# Patient Record
Sex: Female | Born: 1985 | Hispanic: Yes | Marital: Single | Smoking: Never smoker
Health system: Southern US, Community
[De-identification: ages and names within clinical notes are randomized; demographics above are authoritative.]

## PROBLEM LIST (undated history)

## (undated) ENCOUNTER — Inpatient Hospital Stay: Payer: Self-pay

## (undated) HISTORY — PX: CERVICAL BIOPSY: SHX590

---

## 2008-03-22 ENCOUNTER — Emergency Department: Payer: Self-pay | Admitting: Emergency Medicine

## 2008-03-24 ENCOUNTER — Emergency Department: Payer: Self-pay | Admitting: Emergency Medicine

## 2008-03-24 ENCOUNTER — Ambulatory Visit: Payer: Self-pay | Admitting: Emergency Medicine

## 2008-10-20 ENCOUNTER — Ambulatory Visit: Payer: Self-pay | Admitting: Family Medicine

## 2008-11-07 ENCOUNTER — Observation Stay: Payer: Self-pay | Admitting: Unknown Physician Specialty

## 2008-12-25 ENCOUNTER — Encounter: Payer: Self-pay | Admitting: Maternal & Fetal Medicine

## 2015-01-19 DIAGNOSIS — Z79899 Other long term (current) drug therapy: Secondary | ICD-10-CM | POA: Insufficient documentation

## 2015-01-19 DIAGNOSIS — N3090 Cystitis, unspecified without hematuria: Secondary | ICD-10-CM | POA: Insufficient documentation

## 2015-01-19 DIAGNOSIS — G500 Trigeminal neuralgia: Secondary | ICD-10-CM | POA: Insufficient documentation

## 2015-01-19 DIAGNOSIS — Z792 Long term (current) use of antibiotics: Secondary | ICD-10-CM | POA: Insufficient documentation

## 2015-01-19 DIAGNOSIS — Z3202 Encounter for pregnancy test, result negative: Secondary | ICD-10-CM | POA: Insufficient documentation

## 2015-01-20 ENCOUNTER — Encounter: Payer: Self-pay | Admitting: *Deleted

## 2015-01-20 ENCOUNTER — Emergency Department
Admission: EM | Admit: 2015-01-20 | Discharge: 2015-01-20 | Disposition: A | Discharge status: Home / Self Care | Payer: Medicaid Other | Attending: Emergency Medicine | Admitting: Emergency Medicine

## 2015-01-20 ENCOUNTER — Other Ambulatory Visit: Payer: Self-pay

## 2015-01-20 DIAGNOSIS — G500 Trigeminal neuralgia: Secondary | ICD-10-CM

## 2015-01-20 DIAGNOSIS — N3090 Cystitis, unspecified without hematuria: Secondary | ICD-10-CM

## 2015-01-20 LAB — COMPREHENSIVE METABOLIC PANEL
ALT: 12 U/L — AB (ref 14–54)
ANION GAP: 8 (ref 5–15)
AST: 16 U/L (ref 15–41)
Albumin: 4.9 g/dL (ref 3.5–5.0)
Alkaline Phosphatase: 56 U/L (ref 38–126)
BILIRUBIN TOTAL: 0.7 mg/dL (ref 0.3–1.2)
BUN: 10 mg/dL (ref 6–20)
CO2: 27 mmol/L (ref 22–32)
Calcium: 9.6 mg/dL (ref 8.9–10.3)
Chloride: 103 mmol/L (ref 101–111)
Creatinine, Ser: 0.64 mg/dL (ref 0.44–1.00)
GFR calc Af Amer: >60 mL/min (ref >60)
GFR calc non Af Amer: >60 mL/min (ref >60)
Glucose, Bld: 107 mg/dL — ABNORMAL HIGH (ref 65–99)
Potassium: 3.5 mmol/L (ref 3.5–5.1)
Sodium: 138 mmol/L (ref 135–145)
TOTAL PROTEIN: 9.2 g/dL — AB (ref 6.5–8.1)

## 2015-01-20 LAB — CBC WITH DIFFERENTIAL/PLATELET
BASOS ABS: 0.1 10*3/uL (ref 0–0.1)
Basophils Relative: 1 %
Eosinophils Absolute: 0.3 10*3/uL (ref 0–0.7)
Eosinophils Relative: 3 %
HEMATOCRIT: 37.5 % (ref 35.0–47.0)
Hemoglobin: 12.6 g/dL (ref 12.0–16.0)
Lymphocytes Relative: 19 %
Lymphs Abs: 2 10*3/uL (ref 1.0–3.6)
MCH: 28.8 pg (ref 26.0–34.0)
MCHC: 33.6 g/dL (ref 32.0–36.0)
MCV: 85.7 fL (ref 80.0–100.0)
Monocytes Absolute: 0.5 10*3/uL (ref 0.2–0.9)
Monocytes Relative: 4 %
NEUTROS PCT: 73 %
Neutro Abs: 7.5 10*3/uL — ABNORMAL HIGH (ref 1.4–6.5)
Platelets: 237 10*3/uL (ref 150–440)
RBC: 4.37 MIL/uL (ref 3.80–5.20)
RDW: 14.6 % — ABNORMAL HIGH (ref 11.5–14.5)
WBC: 10.2 10*3/uL (ref 3.6–11.0)

## 2015-01-20 LAB — TROPONIN I: Troponin I: <0.03 ng/mL (ref <0.031)

## 2015-01-20 LAB — LIPASE, BLOOD: Lipase: 37 U/L (ref 22–51)

## 2015-01-20 LAB — PREGNANCY, URINE: Preg Test, Ur: NEGATIVE

## 2015-01-20 MED ORDER — CARBAMAZEPINE 100 MG PO CHEW: 100.0000 mg | CHEWABLE_TABLET | Freq: Two times a day (BID) | ORAL | Status: DC

## 2015-01-20 MED ORDER — CEPHALEXIN 500 MG PO CAPS: 500.0000 mg | ORAL_CAPSULE | Freq: Three times a day (TID) | ORAL | Status: DC

## 2015-01-20 NOTE — ED Provider Notes (Addendum)
[facility] Emergency Department Provider Note  ____________________________________________  Time seen: 2:10 AM  I have reviewed the triage vital signs and the nursing notes.   HISTORY  Chief Complaint Abdominal Cramping  Seen with Spanish interpreter  HPI Stacy Blankenship is a 29 y.o. female who complains of gradual onset gradually worsening generalized cramping abdominal pain starting at 10:30 PM tonight and lasting for about 2 hours. He is currently resolved. She has no nausea vomiting or diarrhea. She does report having dysuria for the past 2-3 days. No fever or chills, and normal oral intake at this time.  She also reports dizziness and intermittent left facial pain that is sharp and stabbing and lasting about one hour at a time. A in the left forehead and left maxillary area. She's never had anything like that before and denies any trauma or weakness or vision changes. With the pain she also experiences lacrimation of the left eye. No vision changes or headaches other than that.  Last menstrual period was May 30 which was normal timing for her no vaginal bleeding or discharge.   History reviewed. No pertinent past medical history.  There are no active problems to display for this patient.   Past Surgical History  Procedure Laterality Date  . Cervical biopsy      Current Outpatient Rx  Name  Route  Sig  Dispense  Refill  . carbamazepine (TEGRETOL) 100 MG chewable tablet   Oral   Chew 1 tablet (100 mg total) by mouth 2 (two) times daily.   14 tablet   0   . cephALEXin (KEFLEX) 500 MG capsule   Oral   Take 1 capsule (500 mg total) by mouth 3 (three) times daily.   21 capsule   0     Allergies Review of patient's allergies indicates no known allergies.  History reviewed. No pertinent family history.  Social History History  Substance Use Topics  . Smoking status: Never Smoker   . Smokeless tobacco: Never Used  . Alcohol Use:  No    Review of Systems  Constitutional: No fever or chills. No weight changes Eyes:No blurry vision or double vision.  ENT: No sore throat. Cardiovascular: No chest pain. Respiratory: No dyspnea or cough. Gastrointestinal: Generalized abdominal pain without vomiting or diarrhea.  No BRBPR or melena. Genitourinary: Negative for dysuria, urinary retention, bloody urine, or difficulty urinating. Musculoskeletal: Negative for back pain. No joint swelling or pain. Skin: Negative for rash. Neurological: Left facial pain as above. Psychiatric:No anxiety or depression.   Endocrine:No hot/cold intolerance, changes in energy, or sleep difficulty.  10-point ROS otherwise negative.  ____________________________________________   PHYSICAL EXAM:  VITAL SIGNS: ED Triage Vitals  Enc Vitals Group     BP 01/20/15 0040 100/66 mmHg     Pulse Rate 01/20/15 0040 76     Resp 01/20/15 0040 99     Temp 01/20/15 0040 98.3 F (36.8 C)     Temp Source 01/20/15 0040 Oral     SpO2 01/20/15 0040 100 %     Weight 01/20/15 0040 130 lb (58.968 kg)     Height 01/20/15 0040  (1.6 m)     Head Cir --      Peak Flow --      Pain Score --      Pain Loc --      Pain Edu? --      Excl. in GC? --      Constitutional: Alert and oriented.  Well appearing and in no distress. Eyes: No scleral icterus. No conjunctival pallor. PERRL. EOMI ENT   Head: Normocephalic and atraumatic.   Nose: No congestion/rhinnorhea. No septal hematoma   Mouth/Throat: MMM, no pharyngeal erythema. No peritonsillar mass. No uvula shift.   Neck: No stridor. No SubQ emphysema. No meningismus. Hematological/Lymphatic/Immunilogical: No cervical lymphadenopathy. Cardiovascular: RRR. Normal and symmetric distal pulses are present in all extremities. No murmurs, rubs, or gallops. Respiratory: Normal respiratory effort without tachypnea nor retractions. Breath sounds are clear and equal bilaterally. No  wheezes/rales/rhonchi. Gastrointestinal: Soft and nontender. No distention. There is no CVA tenderness.  No rebound, rigidity, or guarding. Genitourinary: deferred Musculoskeletal: Nontender with normal range of motion in all extremities. No joint effusions.  No lower extremity tenderness.  No edema. Neurologic:   Normal speech and language.  CN 2-10 normal. Motor grossly intact. No pronator drift.  Normal gait. No gross focal neurologic deficits are appreciated.  Skin:  Skin is warm, dry and intact. No rash noted.  No petechiae, purpura, or bullae. Psychiatric: Mood and affect are normal. Speech and behavior are normal. Patient exhibits appropriate insight and judgment.  ____________________________________________    LABS (pertinent positives/negatives) (all labs ordered are listed, but only abnormal results are displayed) Labs Reviewed  CBC WITH DIFFERENTIAL/PLATELET - Abnormal; Notable for the following:    RDW 14.6 (*)    Neutro Abs 7.5 (*)    All other components within normal limits  COMPREHENSIVE METABOLIC PANEL - Abnormal; Notable for the following:    Glucose, Bld 107 (*)    Total Protein 9.2 (*)    ALT 12 (*)    All other components within normal limits  PREGNANCY, URINE  LIPASE, BLOOD  TROPONIN I  URINALYSIS COMPLETEWITH MICROSCOPIC ([facility] ONLY)   ____________________________________________   EKG  Interpreted by me Normal sinus rhythm rate of 69, leftward axis, normal intervals, normal QRS, ST segments and T waves.  ____________________________________________    RADIOLOGY    ____________________________________________   PROCEDURES  ____________________________________________   INITIAL IMPRESSION / ASSESSMENT AND PLAN / ED COURSE  Pertinent labs & imaging results that were available during my care of the patient were reviewed by me and considered in my medical decision making (see chart for details).  Presentation consistent with urinary  tract infection. There is no evidence of pyelonephritis. Additionally the patient has symptoms strongly consistent with trigeminal neuralgia. I'll put her on a trial of carbamazepine and [facility] her symptoms. Well appearing no acute distress symptoms improving spontaneously.  ____________________________________________   FINAL CLINICAL IMPRESSION(S) / ED DIAGNOSES  Final diagnoses:  Cystitis  Trigeminal neuralgia of left side of face      Sharman CheekPhillip Darris Staiger, MD 01/20/15 0235  Sharman CheekPhillip Kielan Dreisbach, MD 01/20/15 [phone]

## 2015-01-20 NOTE — ED Notes (Signed)
Pt states began to experience all over body cramps including abd at 2230 yesterday and they lasted approx 1.5 hours. Pt states while having abd cramping she felt intermittently dizzy, pt denies fever, chills, n/v/d. Pt also complains of intermittent for one week left sided upper facial pain that is pulsing in nature from lateral left eye to left cheek. Pt with redness noted to left inner sclera. Cms intact in all extremities, smile symmetrical.

## 2015-01-20 NOTE — Discharge Instructions (Signed)
Neuralgia del trigmino (Trigeminal Neuralgia) La neuralgia del trigmino es un trastorno nervioso que causa ataques sbitos de intenso dolor facial. La causa es un trastorno en el nervio trigmino, uno de los nervios principales de la cara. Es ms frecuente en las mujeres y en los Stacy Blankenship, aunque tambin puede ocurrir en pacientes ms jvenes. Los ataques pueden durar desde algunos segundos hasta varios minutos, y pueden producirse una o dos veces por ao hasta varias veces por da. La neuralgia del trigmino puede ser un trastorno muy angustiante y discapacitante. Podr ser Stacy Huxleynecesaria una ciruga en los casos muy graves, si el tratamiento mdico no proporciona alivio. INSTRUCCIONES PARA EL CUIDADO DOMICILIARIO  Si su mdico le prescribe medicamentos para prevenir los ataques, tmelos como se le indica.  Para prevenir los ataques:  Mastique con el lado de la boca que no est afectado.  Evite tocarse el rostro.  Evite las rfagas de aire fro o caliente.  Los hombres pueden dejarse crecer la barba para Civil Service fast streamerevitar afeitarse. SOLICITE ATENCIN MDICA DE INMEDIATO SI:  El dolor es insoportable y los medicamentos no lo Egyptayudan.  Le aparecen nuevos e inexplicables sntomas (problemas).  Tiene algn problema que pueda relacionarse con los medicamentos que est tomando. Document Released: 05/14/2005 Document Revised: 10/27/2011 [facility] Patient Information 2015 [place], [place]. This information is not intended to replace advice given to you by your health care provider. Make sure you discuss any questions you have with your health care provider.  Infeccin urinaria  (Urinary Tract Infection)  La infeccin urinaria puede ocurrir en Corporate treasurercualquier lugar del tracto urinario. El tracto urinario es un sistema de drenaje del cuerpo por el que se eliminan los desechos y el exceso de [place]. El tracto urinario est formado por dos riones, dos urteres, la vejiga y Engineer, miningla uretra. Los riones son rganos que tienen  forma de frijol. Cada rin tiene aproximadamente el tamao del puo. Estn situados debajo de las Quioguecostillas, uno a cada lado de la columna vertebral CAUSAS  La causa de la infeccin son los microbios, que son organismos microscpicos, que incluyen hongos, virus, y bacterias. Estos organismos son tan pequeos que slo pueden verse a travs del microscopio. Las bacterias son los microorganismos que ms comnmente causan infecciones urinarias.  SNTOMAS  Los sntomas pueden variar segn la edad y el sexo del paciente y por la ubicacin de la infeccin. Los sntomas en las mujeres jvenes incluyen la necesidad frecuente e intensa de orinar y una sensacin dolorosa de ardor en la vejiga o en la uretra durante la miccin. Las mujeres y los hombres mayores podrn sentir cansancio, temblores y debilidad y Futures tradersentir dolores musculares y Engineer, miningdolor abdominal. Si tiene Wickliffefiebre, puede significar que la infeccin est en los riones. Otros sntomas son dolor en la espalda o en los lados debajo de las [place] Hillcostillas, nuseas y vmitos.  DIAGNSTICO  Para diagnosticar una infeccin urinaria, el mdico le preguntar acerca de sus sntomas. Genuine Partsambin le solicitar una Frederickmuestra de Comorosorina. La muestra de orina se analiza para Engineer, manufacturingdetectar bacterias y glbulos blancos de Risk managerla sangre. Los glbulos blancos se forman en el organismo para ayudar a Artistcombatir las infecciones.  TRATAMIENTO  Por lo general, las infecciones urinarias pueden tratarse con medicamentos. Debido a que la Harley-Davidsonmayora de las infecciones son causadas por bacterias, por lo general pueden tratarse con antibiticos. La eleccin del antibitico y la duracin del tratamiento depender de sus sntomas y el tipo de bacteria causante de la infeccin.  INSTRUCCIONES PARA EL CUIDADO EN EL HOGAR  Si le recetaron antibiticos, tmelos exactamente como su mdico le indique. Termine el medicamento aunque se sienta mejor despus de haber tomado slo algunos.  Beba gran cantidad de lquido para  mantener la orina de tono claro o color amarillo plido.  Evite la cafena, el t y las [address] Estas sustancias irritan la vejiga.  Vaciar la vejiga con frecuencia. Evite retener la orina durante largos perodos.  Vace la vejiga antes y despus de Management consultant.  Despus de mover el intestino, las mujeres deben higienizarse la regin perineal desde adelante hacia atrs. Use slo un papel tissue por vez. SOLICITE ATENCIN MDICA SI:   Siente dolor en la espalda.  Le sube la fiebre.  Los sntomas no mejoran luego de [address]. SOLICITE ATENCIN MDICA DE INMEDIATO SI:   Siente dolor intenso en la espalda o en la zona inferior del abdomen.  Comienza a sentir escalofros.  Tiene nuseas o vmitos.  Tiene una sensacin continua de quemazn o molestias al [employer]. ASEGRESE DE QUE:   Comprende estas instrucciones.  Controlar su enfermedad.  Solicitar ayuda de inmediato si no mejora o empeora. Document Released: 05/14/2005 Document Revised: 04/28/2012 [facility] Patient Information 2015 [place], [place]. This information is not intended to replace advice given to you by your health care provider. Make sure you discuss any questions you have with your health care provider.

## 2015-01-20 NOTE — ED Notes (Signed)
Pt c/o cramping abdominal pain starting at 2230 last night lasting until 0030. Pt in no acute distress at this time. Pt denies n/v/d during this time. Pt states various unrelated pain and cramping in fingers.

## 2015-08-02 LAB — OB RESULTS CONSOLE HIV ANTIBODY (ROUTINE TESTING)
HIV: NONREACTIVE
HIV: NONREACTIVE

## 2015-08-02 LAB — OB RESULTS CONSOLE HEPATITIS B SURFACE ANTIGEN: HEP B S AG: NEGATIVE

## 2015-08-02 LAB — OB RESULTS CONSOLE VARICELLA ZOSTER ANTIBODY, IGG: VARICELLA IGG: IMMUNE

## 2015-08-02 LAB — OB RESULTS CONSOLE RPR: RPR: NONREACTIVE

## 2015-08-02 LAB — OB RESULTS CONSOLE RUBELLA ANTIBODY, IGM: RUBELLA: IMMUNE

## 2015-08-19 NOTE — L&D Delivery Note (Signed)
Delivery Note At 3:51 PM a viable female was delivered via Vaginal, Spontaneous Delivery (Presentation: ;  ).  APGAR: 7, 9; weight  .   Placenta status: , Spontaneous.  Cord: 3 vessels with the following complications: .  Cord pH: not done. Delayed cord clamping  done.   Anesthesia: None  Episiotomy:  None  Lacerations:  First degree vaginal Suture Repair: 2.0 vicryl Est. Blood Loss (mL):  450cc 0.2 mg Methergine given  Mom to postpartum.  Baby to Couplet care / Skin to Skin.  [name] 02/15/2016, 4:15 PM

## 2015-09-12 ENCOUNTER — Other Ambulatory Visit: Payer: Self-pay | Admitting: Primary Care

## 2015-09-12 DIAGNOSIS — Z3492 Encounter for supervision of normal pregnancy, unspecified, second trimester: Secondary | ICD-10-CM

## 2015-09-18 ENCOUNTER — Ambulatory Visit
Admission: RE | Admit: 2015-09-18 | Discharge: 2015-09-18 | Disposition: A | Discharge status: Home / Self Care | Payer: Self-pay | Source: Ambulatory Visit | Attending: Primary Care | Admitting: Primary Care

## 2015-09-18 ENCOUNTER — Ambulatory Visit: Payer: Medicaid Other

## 2015-09-18 DIAGNOSIS — Z36 Encounter for antenatal screening of mother: Secondary | ICD-10-CM | POA: Insufficient documentation

## 2015-09-18 DIAGNOSIS — Z3A18 18 weeks gestation of pregnancy: Secondary | ICD-10-CM | POA: Insufficient documentation

## 2015-09-18 DIAGNOSIS — Z3492 Encounter for supervision of normal pregnancy, unspecified, second trimester: Secondary | ICD-10-CM

## 2015-09-25 ENCOUNTER — Encounter: Payer: Self-pay | Admitting: *Deleted

## 2015-09-25 ENCOUNTER — Inpatient Hospital Stay
Admission: EM | Admit: 2015-09-25 | Discharge: 2015-09-25 | Disposition: A | Discharge status: Home / Self Care | Payer: Self-pay | Attending: Obstetrics and Gynecology | Admitting: Obstetrics and Gynecology

## 2015-09-25 DIAGNOSIS — O2342 Unspecified infection of urinary tract in pregnancy, second trimester: Secondary | ICD-10-CM | POA: Insufficient documentation

## 2015-09-25 DIAGNOSIS — O26892 Other specified pregnancy related conditions, second trimester: Secondary | ICD-10-CM | POA: Insufficient documentation

## 2015-09-25 DIAGNOSIS — R300 Dysuria: Secondary | ICD-10-CM | POA: Diagnosis present

## 2015-09-25 DIAGNOSIS — R1031 Right lower quadrant pain: Secondary | ICD-10-CM | POA: Insufficient documentation

## 2015-09-25 DIAGNOSIS — Z3A20 20 weeks gestation of pregnancy: Secondary | ICD-10-CM | POA: Insufficient documentation

## 2015-09-25 LAB — URINALYSIS COMPLETE WITH MICROSCOPIC (ARMC ONLY)
Bilirubin Urine: NEGATIVE
Glucose, UA: NEGATIVE mg/dL
Hgb urine dipstick: NEGATIVE
Ketones, ur: NEGATIVE mg/dL
Nitrite: POSITIVE — AB
Protein, ur: NEGATIVE mg/dL
Specific Gravity, Urine: 1.012 (ref 1.005–1.030)
pH: 6 (ref 5.0–8.0)

## 2015-09-25 LAB — URINE DRUG SCREEN, QUALITATIVE (ARMC ONLY)
AMPHETAMINES, UR SCREEN: NOT DETECTED
BARBITURATES, UR SCREEN: NOT DETECTED
Benzodiazepine, Ur Scrn: NOT DETECTED
Cannabinoid 50 Ng, Ur ~~LOC~~: NOT DETECTED
Cocaine Metabolite,Ur ~~LOC~~: NOT DETECTED
MDMA (Ecstasy)Ur Screen: NOT DETECTED
Methadone Scn, Ur: NOT DETECTED
Opiate, Ur Screen: NOT DETECTED
Phencyclidine (PCP) Ur S: NOT DETECTED
TRICYCLIC, UR SCREEN: NOT DETECTED

## 2015-09-25 NOTE — OB Triage Note (Signed)
[id number] spanish speaking pt at 20w gestation presents with c/o right sided abdominal pain. No LOF, bleeding. +FM.

## 2015-09-25 NOTE — H&P (Signed)
ANTEPARTUM ADMISSION HISTORY AND PHYSICAL NOTE   History of Present Illness: Stacy Blankenship is a 30 y.o. [phone] at [redacted]w[redacted]d admitted for RLQ pain and cannot sleep x 3 nights.  Patient reports the fetal movement as active. Patient reports uterine contraction  activity as none. Patient reports  vaginal bleeding as none. Patient describes fluid per vagina as None. Fetal presentation is unsure.  Patient Active Problem List   Diagnosis Date Noted  . Dysuria 09/25/2015    History reviewed. No pertinent past medical history.  Past Surgical History  Procedure Laterality Date  . Cervical biopsy      OB History  Gravida Para Term Preterm AB SAB TAB Ectopic Multiple Living  # Outcome Date GA Lbr Len/2nd Weight Sex Delivery Anes PTL Lv  6 Current           5 Term 03/24/11    M Vag-Spont  N Y  4 SAB           3 SAB           2 SAB           1 SAB               Social History   Social History  . Marital Status: Single    Spouse Name: N/A  . Number of Children: N/A  . Years of Education: N/A   Social History Main Topics  . Smoking status: Never Smoker   . Smokeless tobacco: Never Used  . Alcohol Use: No  . Drug Use: No  . Sexual Activity: No   Other Topics Concern  . None   Social History Narrative  Pt has one 28 yr old and lives with her sister. Has 4 brothers in [place]. Parents died: Mom died at age 44 of DM and MI. Dad died at 49 of DM and MI.   History reviewed. No pertinent family history.  No Known Allergies  MEDS: PNV's  Review of Systems - Negative except RLQ pain, cannot sleep x 3 days, dysuria History obtained from the patient General ROS: positive for  - chills, insomnia, aching all over  Vitals:  BP 102/55 mmHg  Pulse 67  Temp(Src) 98.5 F (36.9 C) (Oral)  Resp 20  LMP 01/15/2015 (Exact Date) Physical Examination: GENERAL: Well-developed, well-nourished female in no acute distress.  LUNGS: Clear to auscultation bilaterally.  No W/R/R. HEART: Regular rate and rhythm.S1S2, RRR, No M/R/G.  ABDOMEN: Soft, nontender, nondistended. No organomegaly. Gravid at 20 weeks EXTREMITIES: No cyanosis, clubbing, or edema, 2+ distal pulses with DTRs Rt: 1+/0 Lt: 1+/0 CERVIX: Not evaluated and fetal presentation is unknown  Membranes:intact Fetal Monitoring: + FHT's (unable to monitor due to age of fetus) Tocometer: Flat  Labs:  Results for orders placed or performed during the hospital encounter of 09/25/15 (from the past 24 hour(s))  Urinalysis complete, with microscopic ([facility] only)   Collection Time: 09/25/15  3:21 AM  Result Value Ref Range   Color, Urine YELLOW (A) YELLOW   APPearance CLOUDY (A) CLEAR   Glucose, UA NEGATIVE NEGATIVE mg/dL   Bilirubin Urine NEGATIVE NEGATIVE   Ketones, ur NEGATIVE NEGATIVE mg/dL   Specific Gravity, Urine 1.012 1.005 - 1.030   Hgb urine dipstick NEGATIVE NEGATIVE   pH 6.0 5.0 - 8.0   Protein, ur NEGATIVE NEGATIVE mg/dL   Nitrite POSITIVE (A) NEGATIVE   Leukocytes, UA 3+ (A) NEGATIVE  RBC / HPF TOO NUMEROUS TO COUNT 0 - 5 RBC/hpf   WBC, UA TOO NUMEROUS TO COUNT 0 - 5 WBC/hpf   Bacteria, UA MANY (A) NONE SEEN   Squamous Epithelial / LPF 6-30 (A) NONE SEEN   WBC Clumps PRESENT    Mucous PRESENT     Imaging Studies: US Ob Comp + 14 Wk  09/18/2015  CLINICAL DATA:  Pregnancy. EXAM: OBSTETRICAL ULTRASOUND >14 WKS FINDINGS: Number of Fetuses: 1 Heart Rate:  145 bpm Movement: Present Presentation: Cephalic Previa: None Placental Location: Posterior lateral Amniotic Fluid (Subjective): Normal Amniotic Fluid (Objective): Vertical pocket 4.5cm FETAL BIOMETRY BPD:  4.1cm 18w 4d HC:    15.3cm  18w   2d AC:   13.1cm  18w   4d FL:   2.8cm  18w   4d Current Mean GA: 18w 4d              [place] EDC: 02/15/2016 Estimated Fetal Weight:  245g    9%ile FETAL ANATOMY Lateral Ventricles: Visualized Thalami/CSP: Visualized Posterior Fossa:  Visualized Nuchal Region: Visualize    NFT= 3.73mm Upper Lip: Visualized  Spine: Visualized 4 Chamber Heart on Left: Visualized LVOT: Visualized RVOT: Visualized Stomach on Left: Visualized 3 Vessel Cord: Visualized Cord Insertion site: Visualized Kidneys: Visualized Bladder: Visualized Extremities: Visualized Technically difficult due to: Fetal position. Maternal Findings: Cervix:  4.5 cm and closed. IMPRESSION: Single viable intrauterine pregnancy at 18 weeks 4 days. Electronically Signed   By: Maisie Fus  Register   On: 09/18/2015 14:57     Assessment and Plan: Patient Active Problem List   Diagnosis Date Noted  . Dysuria 09/25/2015  Pain in Rt lower abd area 2. UTI 3. IUP at 20 weeks with 9% Growth at 18 week scan 4. Possible depression due to limited support and no relatiionship with either Father of baby 5. LGSIL: colp with poss HGSIL but, no report on pathology to indicate type of cells (Pt failed to fu for 4 month pap in Aug/Sept 2016 6. Out of work till improved (note given) 7. Will communicate to [facility] plans for repeat Pap PP and fetal surveillance with [facility] 8. Enc Benadryl for sleep 9. The IPAD Interpreter was used for this consult.  10. Pt encouraged to fu at [facility] for further care.

## 2015-10-18 LAB — OB RESULTS CONSOLE GC/CHLAMYDIA
CHLAMYDIA, DNA PROBE: NEGATIVE
CHLAMYDIA, DNA PROBE: NEGATIVE
GC PROBE AMP, GENITAL: NEGATIVE
Gonorrhea: NEGATIVE

## 2016-01-12 LAB — OB RESULTS CONSOLE GBS: GBS: NEGATIVE

## 2016-02-14 ENCOUNTER — Inpatient Hospital Stay
Admission: EM | Admit: 2016-02-14 | Discharge: 2016-02-16 | DRG: 775 | Disposition: A | Discharge status: Home / Self Care | Payer: Medicaid Other | Attending: Obstetrics and Gynecology | Admitting: Obstetrics and Gynecology

## 2016-02-14 DIAGNOSIS — O365930 Maternal care for other known or suspected poor fetal growth, third trimester, not applicable or unspecified: Secondary | ICD-10-CM | POA: Diagnosis present

## 2016-02-14 DIAGNOSIS — Z3A40 40 weeks gestation of pregnancy: Secondary | ICD-10-CM | POA: Diagnosis not present

## 2016-02-14 DIAGNOSIS — O480 Post-term pregnancy: Secondary | ICD-10-CM | POA: Diagnosis present

## 2016-02-14 DIAGNOSIS — D509 Iron deficiency anemia, unspecified: Secondary | ICD-10-CM | POA: Diagnosis present

## 2016-02-14 DIAGNOSIS — R87613 High grade squamous intraepithelial lesion on cytologic smear of cervix (HGSIL): Secondary | ICD-10-CM | POA: Diagnosis present

## 2016-02-14 DIAGNOSIS — O9902 Anemia complicating childbirth: Secondary | ICD-10-CM | POA: Diagnosis present

## 2016-02-14 DIAGNOSIS — R87612 Low grade squamous intraepithelial lesion on cytologic smear of cervix (LGSIL): Secondary | ICD-10-CM | POA: Diagnosis present

## 2016-02-14 LAB — CBC
HEMATOCRIT: 33.1 % — AB (ref 35.0–47.0)
Hemoglobin: 11 g/dL — ABNORMAL LOW (ref 12.0–16.0)
MCH: 26.8 pg (ref 26.0–34.0)
MCHC: 33.3 g/dL (ref 32.0–36.0)
MCV: 80.3 fL (ref 80.0–100.0)
Platelets: 146 10*3/uL — ABNORMAL LOW (ref 150–440)
RBC: 4.12 MIL/uL (ref 3.80–5.20)
RDW: 16.2 % — AB (ref 11.5–14.5)
WBC: 10.8 10*3/uL (ref 3.6–11.0)

## 2016-02-14 LAB — TYPE AND SCREEN
ABO/RH(D): O POS
ANTIBODY SCREEN: NEGATIVE

## 2016-02-14 MED ORDER — LACTATED RINGERS IV SOLN: 500.0000 mL | INTRAVENOUS | Status: DC | PRN

## 2016-02-14 MED ORDER — ACETAMINOPHEN 325 MG PO TABS: 650.0000 mg | ORAL_TABLET | ORAL | Status: DC | PRN

## 2016-02-14 MED ORDER — ONDANSETRON HCL 4 MG/2ML IJ SOLN: 4.0000 mg | Freq: Four times a day (QID) | INTRAMUSCULAR | Status: DC | PRN

## 2016-02-14 MED ORDER — SOD CITRATE-CITRIC ACID 500-334 MG/5ML PO SOLN: 30.0000 mL | ORAL | Status: DC | PRN

## 2016-02-14 MED ORDER — OXYTOCIN 40 UNITS IN LACTATED RINGERS INFUSION - SIMPLE MED
2.5000 [IU]/h | INTRAVENOUS | Status: DC
  Administered 2016-02-15: 39.96 [IU]/h via INTRAVENOUS
  Administered 2016-02-15: 2.5 [IU]/h via INTRAVENOUS
  Filled 2016-02-14: qty 1000

## 2016-02-14 MED ORDER — MISOPROSTOL 200 MCG PO TABS
ORAL_TABLET | ORAL | Status: AC
  Filled 2016-02-14: qty 4

## 2016-02-14 MED ORDER — LIDOCAINE HCL (PF) 1 % IJ SOLN
30.0000 mL | INTRAMUSCULAR | Status: DC | PRN
  Filled 2016-02-14: qty 30

## 2016-02-14 MED ORDER — OXYTOCIN BOLUS FROM INFUSION: 500.0000 mL | INTRAVENOUS | Status: DC

## 2016-02-14 MED ORDER — AMMONIA AROMATIC IN INHA
RESPIRATORY_TRACT | Status: AC
  Filled 2016-02-14: qty 10

## 2016-02-14 MED ORDER — LACTATED RINGERS IV SOLN
INTRAVENOUS | Status: DC
  Administered 2016-02-14 – 2016-02-15 (×3): via INTRAVENOUS

## 2016-02-14 MED ORDER — DINOPROSTONE 10 MG VA INST
10.0000 mg | VAGINAL_INSERT | Freq: Once | VAGINAL | Status: AC
  Administered 2016-02-14: 10 mg via VAGINAL
  Filled 2016-02-14: qty 1

## 2016-02-14 MED ORDER — BUTORPHANOL TARTRATE 1 MG/ML IJ SOLN
1.0000 mg | INTRAMUSCULAR | Status: DC | PRN
  Administered 2016-02-15: 1 mg via INTRAVENOUS
  Filled 2016-02-14: qty 1

## 2016-02-14 MED ORDER — TERBUTALINE SULFATE 1 MG/ML IJ SOLN: 0.2500 mg | Freq: Once | INTRAMUSCULAR | Status: DC | PRN

## 2016-02-14 NOTE — H&P (Signed)
OB ADMISSION/ HISTORY & PHYSICAL:  Admission Date: 02/14/2016  4:19 PM  Admit Diagnosis: Postdates induction of labor at 40+6 weeks   Stacy Blankenship is a 30 y.o. female [id number] at 7140+6 presenting for induction of labor for postdates.     Prenatal History: [id number]   EDC : 02/08/2016, by Last Menstrual Period 05/03/16 [facility] complicated by: growth restriction in the 9th% at 18 weeks - per records pt. 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 No pertinent past medical history.   Past surgical history:  Past Surgical History  Procedure Laterality Date  . Cervical biopsy      Family History: No family history on file.   Social History:  reports that she has never smoked. She has never used smokeless tobacco. She reports that she does not drink alcohol or use illicit drugs.   Allergies: Review of patient's allergies indicates no known allergies.    Current Medications at time of admission:  Prior to Admission medications   Medication Sig Start Date End Date Taking? Authorizing Provider  ferrous sulfate 325 (65 FE) MG tablet Take 325 mg by mouth daily with breakfast.   Yes Historical Provider, MD  carbamazepine (TEGRETOL) 100 MG chewable tablet Chew 1 tablet (100 mg total) by mouth 2 (two) times daily. 01/20/15 01/20/16  Sharman CheekPhillip Stafford, MD  cephALEXin (KEFLEX) 500 MG capsule Take 1 capsule (500 mg  total) by mouth 3 (three) times daily. Patient not taking: Reported on 02/14/2016 01/20/15   Sharman CheekPhillip Stafford, MD     Review of Systems: Active FM Irregular ctxs No LOF  / SROM  No bloody show    Physical Exam:  VS: Blood pressure 120/72, pulse 81, temperature 98.5 F (36.9 C), temperature source Oral, resp. rate 16, height 5' (1.524 m), weight 70.761 kg (156 lb), last menstrual period 05/04/2015.  General: alert and oriented, appears anxious Heart: RRR Lungs: Clear lung fields Abdomen: Gravid, soft and non-tender, non-distended / uterus: gravid, non-tender Extremities: no edema  Genitalia / VE: Dilation: 2 Effacement (%): 40 Station: -3 Exam by:: Delta Air LinesMeredith Sigmon, CNM  FHR: baseline rate 140 bpm / variability minimal-moderate / accelerations + / no decelerations TOCO: irritability   Assessment: 40+[redacted] weeks gestation Induction of labor FHR category 2, with progression to Category 1    Plan:  [facility]   - Cervidil 10mg  vaginally x 1  - Routine Labor and Delivery orders  - Stadol 1mg  IVP every 1hour PRN  - May have epidural upon request 2. GBS Negative  - No prophylaxis indicated 3. Contraception  - unknown  4. Anticipate NSVD  - Proven Pelvis: 8#14 5. Needs repeat pap at 6 week PP visit for LGSIL: colp with poss HGSIL but, no report on pathology to indicate type of cells (Pt failed to fu for 4 month pap in Aug/Sept 2016  Dr. Dalbert GarnetBeasley notified of admission / plan of care  Carlean JewsMeredith Sigmon, CNM

## 2016-02-15 LAB — RPR: RPR Ser Ql: NONREACTIVE

## 2016-02-15 MED ORDER — ACETAMINOPHEN 325 MG PO TABS: 650.0000 mg | ORAL_TABLET | ORAL | Status: DC | PRN

## 2016-02-15 MED ORDER — ONDANSETRON HCL 4 MG PO TABS: 4.0000 mg | ORAL_TABLET | ORAL | Status: DC | PRN

## 2016-02-15 MED ORDER — METHYLERGONOVINE MALEATE 0.2 MG/ML IJ SOLN
INTRAMUSCULAR | Status: AC
  Administered 2016-02-15: 0.2 mg
  Filled 2016-02-15: qty 1

## 2016-02-15 MED ORDER — MAGNESIUM HYDROXIDE 400 MG/5ML PO SUSP: 30.0000 mL | ORAL | Status: DC | PRN

## 2016-02-15 MED ORDER — OXYTOCIN 40 UNITS IN LACTATED RINGERS INFUSION - SIMPLE MED
1.0000 m[IU]/min | INTRAVENOUS | Status: DC
  Administered 2016-02-15: 666 m[IU]/min via INTRAVENOUS
  Administered 2016-02-15: 1 m[IU]/min via INTRAVENOUS

## 2016-02-15 MED ORDER — SENNOSIDES-DOCUSATE SODIUM 8.6-50 MG PO TABS
2.0000 | ORAL_TABLET | ORAL | Status: DC
Start: 2016-02-16 — End: 2016-02-16
  Administered 2016-02-16: 2 via ORAL
  Filled 2016-02-15: qty 2

## 2016-02-15 MED ORDER — DIBUCAINE 1 % RE OINT: 1.0000 "application " | TOPICAL_OINTMENT | RECTAL | Status: DC | PRN

## 2016-02-15 MED ORDER — SODIUM CHLORIDE FLUSH 0.9 % IV SOLN
INTRAVENOUS | Status: AC
  Filled 2016-02-15: qty 10

## 2016-02-15 MED ORDER — WITCH HAZEL-GLYCERIN EX PADS: 1.0000 "application " | MEDICATED_PAD | CUTANEOUS | Status: DC | PRN

## 2016-02-15 MED ORDER — TERBUTALINE SULFATE 1 MG/ML IJ SOLN: 0.2500 mg | Freq: Once | INTRAMUSCULAR | Status: DC | PRN

## 2016-02-15 MED ORDER — SIMETHICONE 80 MG PO CHEW: 80.0000 mg | CHEWABLE_TABLET | ORAL | Status: DC | PRN

## 2016-02-15 MED ORDER — DIPHENHYDRAMINE HCL 25 MG PO CAPS: 25.0000 mg | ORAL_CAPSULE | Freq: Four times a day (QID) | ORAL | Status: DC | PRN

## 2016-02-15 MED ORDER — COCONUT OIL OIL: 1.0000 "application " | TOPICAL_OIL | Status: DC | PRN

## 2016-02-15 MED ORDER — MEASLES, MUMPS & RUBELLA VAC ~~LOC~~ INJ
0.5000 mL | INJECTION | Freq: Once | SUBCUTANEOUS | Status: DC
  Filled 2016-02-15: qty 0.5

## 2016-02-15 MED ORDER — ZOLPIDEM TARTRATE 5 MG PO TABS: 5.0000 mg | ORAL_TABLET | Freq: Every evening | ORAL | Status: DC | PRN

## 2016-02-15 MED ORDER — ONDANSETRON HCL 4 MG/2ML IJ SOLN: 4.0000 mg | INTRAMUSCULAR | Status: DC | PRN

## 2016-02-15 MED ORDER — IBUPROFEN 600 MG PO TABS
600.0000 mg | ORAL_TABLET | Freq: Four times a day (QID) | ORAL | Status: DC
  Administered 2016-02-15 – 2016-02-16 (×4): 600 mg via ORAL
  Filled 2016-02-15 (×4): qty 1

## 2016-02-15 MED ORDER — PRENATAL MULTIVITAMIN CH
1.0000 | ORAL_TABLET | Freq: Every day | ORAL | Status: DC
  Administered 2016-02-16: 1 via ORAL
  Filled 2016-02-15: qty 1

## 2016-02-15 MED ORDER — FERROUS SULFATE 325 (65 FE) MG PO TABS
325.0000 mg | ORAL_TABLET | Freq: Two times a day (BID) | ORAL | Status: DC
  Administered 2016-02-16 (×2): 325 mg via ORAL
  Filled 2016-02-15 (×2): qty 1

## 2016-02-15 MED ORDER — BENZOCAINE-MENTHOL 20-0.5 % EX AERO
1.0000 "application " | INHALATION_SPRAY | CUTANEOUS | Status: DC | PRN
  Administered 2016-02-15: 1 via TOPICAL
  Filled 2016-02-15: qty 56

## 2016-02-15 NOTE — Progress Notes (Signed)
Patient ID: Humberto SealsMayra Castillo Cordova, female   DOB: 02/19/1986, 30 y.o.   MRN: [id number] Induction day 2 . 40+6 weeks . 9% EFW  Cervidil last . Pitocin at 5 mu/min .  O: VSS efm : 140 + accels , reasurring  CTX q 3 min  AROM - copious clear fluid . Cx 1-2 cm / 50% and -2 vtx . A: reassuring EFM  P: cont Pitocin .

## 2016-02-15 NOTE — Progress Notes (Signed)
Interpreter here to discuss pain, MD notification of SVE, plan for SVE after 1400, epidural discussed, offered, declined by pt.

## 2016-02-15 NOTE — Progress Notes (Signed)
Patient ID: Stacy Blankenship, female   DOB: 02/08/1986, 30 y.o.   MRN: [id number] cx check  4 cm / 90 /0  IUPC placed  A:  Reassuring fetal monitoring P: cont monitoring

## 2016-02-16 LAB — CBC
HCT: 29.3 % — ABNORMAL LOW (ref 35.0–47.0)
Hemoglobin: 9.5 g/dL — ABNORMAL LOW (ref 12.0–16.0)
MCH: 26.2 pg (ref 26.0–34.0)
MCHC: 32.4 g/dL (ref 32.0–36.0)
MCV: 81 fL (ref 80.0–100.0)
Platelets: 134 10*3/uL — ABNORMAL LOW (ref 150–440)
RBC: 3.62 MIL/uL — ABNORMAL LOW (ref 3.80–5.20)
RDW: 16.5 % — AB (ref 11.5–14.5)
WBC: 14 10*3/uL — ABNORMAL HIGH (ref 3.6–11.0)

## 2016-02-16 MED ORDER — IBUPROFEN 600 MG PO TABS: 600.0000 mg | ORAL_TABLET | Freq: Four times a day (QID) | ORAL | Status: AC

## 2016-02-16 NOTE — Progress Notes (Signed)
Discharge instructions provided.  Pt verbalizes understanding of all instructions and follow-up care via interpreter.  Prescription given. Pt discharged to home with infant at 1800 on 02/16/16 via wheelchair by RN. Reynold BowenSusan Paisley [place]ave, RN 02/16/2016 6:38 PM

## 2016-02-16 NOTE — Progress Notes (Signed)
Prenatal records indicate that pt received TDaP vaccine on 12/06/15. Reynold BowenSusan Paisley Kaulin Chaves, RN 02/16/2016 1:21 PM

## 2016-02-16 NOTE — Discharge Summary (Signed)
Obstetric Discharge Summary Reason for Admission: induction of labor 40+6 and  Possible IUGR 9% on u/s  Prenatal Procedures: none Intrapartum Procedures: spontaneous vaginal delivery Postpartum Procedures: none Complications-Operative and Postpartum:first degree laceration   HEMOGLOBIN  Date Value Ref Range Status  02/16/2016 9.5* 12.0 - 16.0 g/dL Final   HCT  Date Value Ref Range Status  02/16/2016 29.3* 35.0 - 47.0 % Final    Physical Exam:  General: alert and cooperative Lochia: appropriate Uterine Fundus: firm Incision: n/a DVT Evaluation: No evidence of DVT seen on physical exam.  Discharge Diagnoses: Term Pregnancy-delivered, fetal weight  8lbs /9oz.  Discharge Information: Date: 02/16/2016 Activity: pelvic rest Diet: routine Medications: Ibuprofen Condition: stable Instructions: refer to practice specific booklet Discharge to: home Follow-up Information    Follow up with [facility] In 6 weeks.   Specialty:  General Practice   Why:  postpartum care   Contact information:   [address] [place] [address] [phone]       Newborn Data: Live born female  Birth Weight: 8 lb 8.5 oz (3870 g) APGAR: 7, 9  Home with mother.  SCHERMERHORN,THOMAS 02/16/2016, 12:33 PM

## 2016-02-16 NOTE — Discharge Instructions (Signed)
Call your doctor for increased pain or vaginal bleeding, temperature above 100.4, depression, or concerns.  No strenuous activity or heavy lifting for 6 weeks.  No intercourse, tampons, douching, or enemas for 6 weeks.  No tub baths-showers only.  No driving for 2 weeks or while taking pain medications.  Continue prenatal vitamin and iron.  Increase calories and fluids while breastfeeding. °

## 2017-07-30 ENCOUNTER — Emergency Department: Payer: Self-pay

## 2017-07-30 ENCOUNTER — Other Ambulatory Visit: Payer: Self-pay

## 2017-07-30 ENCOUNTER — Emergency Department
Admission: EM | Admit: 2017-07-30 | Discharge: 2017-07-30 | Disposition: A | Discharge status: Home / Self Care | Payer: Self-pay | Attending: Emergency Medicine | Admitting: Emergency Medicine

## 2017-07-30 DIAGNOSIS — R202 Paresthesia of skin: Secondary | ICD-10-CM | POA: Insufficient documentation

## 2017-07-30 DIAGNOSIS — Z791 Long term (current) use of non-steroidal anti-inflammatories (NSAID): Secondary | ICD-10-CM | POA: Insufficient documentation

## 2017-07-30 DIAGNOSIS — Z79899 Other long term (current) drug therapy: Secondary | ICD-10-CM | POA: Insufficient documentation

## 2017-07-30 LAB — BASIC METABOLIC PANEL
Anion gap: 10 (ref 5–15)
BUN: 14 mg/dL (ref 6–20)
CHLORIDE: 102 mmol/L (ref 101–111)
CO2: 24 mmol/L (ref 22–32)
CREATININE: 0.74 mg/dL (ref 0.44–1.00)
Calcium: 9.6 mg/dL (ref 8.9–10.3)
GFR calc Af Amer: >60 mL/min (ref >60)
GFR calc non Af Amer: >60 mL/min (ref >60)
GLUCOSE: 152 mg/dL — AB (ref 65–99)
POTASSIUM: 3.4 mmol/L — AB (ref 3.5–5.1)
SODIUM: 136 mmol/L (ref 135–145)

## 2017-07-30 LAB — TROPONIN I: Troponin I: <0.03 ng/mL (ref <0.03)

## 2017-07-30 LAB — CBC
HEMATOCRIT: 37.6 % (ref 35.0–47.0)
Hemoglobin: 12.5 g/dL (ref 12.0–16.0)
MCH: 26.8 pg (ref 26.0–34.0)
MCHC: 33.2 g/dL (ref 32.0–36.0)
MCV: 80.7 fL (ref 80.0–100.0)
Platelets: 262 10*3/uL (ref 150–440)
RBC: 4.66 MIL/uL (ref 3.80–5.20)
RDW: 17.5 % — AB (ref 11.5–14.5)
WBC: 10.5 10*3/uL (ref 3.6–11.0)

## 2017-07-30 LAB — POCT PREGNANCY, URINE: Preg Test, Ur: NEGATIVE

## 2017-07-30 MED ORDER — HYDROXYZINE HCL 25 MG PO TABS: 25.0000 mg | ORAL_TABLET | Freq: Three times a day (TID) | ORAL | 0 refills | Status: AC | PRN

## 2017-07-30 NOTE — ED Provider Notes (Signed)
[facility] Emergency Department Provider Note  Time seen: 9:43 PM  I have reviewed the triage vital signs and the nursing notes.   HISTORY  Chief Complaint Chest Pain Spanish interpreter present throughout the evaluation.   HPI Stacy Blankenship is a 31 y.o. female with no past medical history who presents to the emergency department with complaints of generalized tingling and lightness.  According to the patient since yesterday she has had a feeling of tingling in lightness in her head and upper extremities bilaterally.  Patient was at work today and was complaining of these feelings so she was sent to the emergency department for evaluation.  Patient denies any nausea, vomiting or diarrhea.  Denies any chest pain or shortness of breath to myself but the triage nurse stated the patient was describing some chest heaviness or tightness.  In review of systems questioning the patient states she was having some abdominal discomfort last night but that has since resolved.  Denies any abdominal pain currently.  Also on review of systems questioning the patient states she has been having intermittent headaches over the past 1 week.  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  History reviewed. No pertinent past medical history.  Patient Active Problem List   Diagnosis Date Noted  . Indication for care in labor and delivery, antepartum 02/14/2016  . Dysuria 09/25/2015    Past Surgical History:  Procedure Laterality Date  . CERVICAL BIOPSY      Prior to Admission medications   Medication Sig Start Date End Date Taking? Authorizing Provider  ferrous sulfate 325 (65 FE) MG tablet Take 325 mg by mouth daily with breakfast.    [provider]  hydrOXYzine (ATARAX/VISTARIL) 25 MG tablet Take 1 tablet (25 mg total) by mouth every 8 (eight) hours as needed for anxiety  (tingling). 07/30/17   Minna AntisPaduchowski, Desjuan Stearns, MD  ibuprofen (ADVIL,MOTRIN) 600 MG tablet Take 1 tablet (600 mg total) by mouth every 6 (six) hours. 02/16/16   Schermerhorn, Ihor Austinhomas J, MD    No Known Allergies  No family history on file.  Social History Social History   Tobacco Use  . Smoking status: Never Smoker  . Smokeless tobacco: Never Used  Substance Use Topics  . Alcohol use: No  . Drug use: No    Review of Systems Constitutional: Negative for fever.  Positive for lightheadedness, and lightness sensation in her arms. Cardiovascular: Negative for chest pain. Respiratory: Negative for shortness of breath. Gastrointestinal: Negative for abdominal pain, vomiting Neurological: Intermittent headache times 1 week.  Denies any weakness All other ROS negative  ____________________________________________   PHYSICAL EXAM:  VITAL SIGNS: ED Triage Vitals  Enc Vitals Group     BP 07/30/17 2033 113/77     Pulse Rate 07/30/17 2033 93     Resp 07/30/17 2033 16     Temp 07/30/17 2033 98.1 F (36.7 C)     Temp Source 07/30/17 2033 Oral     SpO2 07/30/17 2033 100 %     Weight 07/30/17 2012 160 lb (72.6 kg)     Height 07/30/17 2012 5\' 5"  (1.651 m)     Head Circumference --      Peak Flow --      Pain Score 07/30/17 2012 3     Pain Loc --      Pain Edu? --      Excl.  in GC? --    Constitutional: Alert and oriented. Well appearing and in no distress. Eyes: Normal exam ENT   Head: Normocephalic and atraumatic.   Mouth/Throat: Mucous membranes are moist. Cardiovascular: Normal rate, regular rhythm. No murmur Respiratory: Normal respiratory effort without tachypnea nor retractions. Breath sounds are clear  Gastrointestinal: Soft and nontender. No distention. Musculoskeletal: Nontender with normal range of motion in all extremities.  Neurologic:  Normal speech and language. No gross focal neurologic deficits Skin:  Skin is warm, dry and intact.  Psychiatric: Mood and affect  are normal.   ____________________________________________    EKG  EKG reviewed and interpreted by myself shows sinus tachycardia at 103 bpm with a narrow QRS, normal axis, normal intervals, nonspecific ST changes.  ____________________________________________    RADIOLOGY  Chest x-ray negative  ____________________________________________   INITIAL IMPRESSION / ASSESSMENT AND PLAN / ED COURSE  Pertinent labs & imaging results that were available during my care of the patient were reviewed by me and considered in my medical decision making (see chart for details).  Patient presents to the emergency department for lightheadedness and a tingling/lightness sensation in her upper extremities that have been ongoing since yesterday.  Differential would include chemical exposure, paresthesias, anxiety, migraine, left leg abnormality or metabolic abnormality.  Overall the patient appears extremely well on examination with a normal physical exam.  Patient's labs are normal including negative troponin and chest x-ray with a non-concerning EKG.  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  Denies any history of anxiety.  Symptoms she is describing are very consistent with paresthesias.  Will prescribe a short course of hydroxyzine for a trial of symptom relief I discussed taking this medication only if needed.  I also discussed following up with her primary care doctor next week for recheck.  Patient agreeable to this plan of care.  ____________________________________________   FINAL CLINICAL IMPRESSION(S) / ED DIAGNOSES  Paresthesias    Minna AntisPaduchowski, Brittley Regner, MD 07/30/17 2152

## 2017-07-30 NOTE — ED Triage Notes (Addendum)
Pt presents to ED via POV with c/o chest pain described as "tightness" and her "whole body is numb" x2 days; pt also reports 2 weeks of dizziness. Pt reports HA and blurry vision; pt denies trouble swallowing or speaking; no facial droop. Pt reports similar s/x's in the past while at work, but s/x's were attributed to excessive heat in her working conditions.

## 2017-11-13 ENCOUNTER — Other Ambulatory Visit: Payer: Self-pay | Admitting: Neurology

## 2017-11-13 DIAGNOSIS — G939 Disorder of brain, unspecified: Secondary | ICD-10-CM

## 2017-11-16 ENCOUNTER — Ambulatory Visit
Admission: RE | Admit: 2017-11-16 | Discharge: 2017-11-16 | Disposition: A | Discharge status: Home / Self Care | Payer: Self-pay | Source: Ambulatory Visit | Attending: Neurology | Admitting: Neurology

## 2017-11-16 DIAGNOSIS — G939 Disorder of brain, unspecified: Secondary | ICD-10-CM

## 2017-11-16 DIAGNOSIS — Q048 Other specified congenital malformations of brain: Secondary | ICD-10-CM | POA: Insufficient documentation

## 2017-11-16 MED ORDER — GADOBENATE DIMEGLUMINE 529 MG/ML IV SOLN
15.0000 mL | Freq: Once | INTRAVENOUS | Status: AC | PRN
  Administered 2017-11-16: 13 mL via INTRAVENOUS

## 2019-07-31 IMAGING — MR MR HEAD WO/W CM
13 series · 35 of 48 positions shown · IV contrast (multihance)
Comparison: None.

CLINICAL DATA: 32 y/o F; dizziness and pain across the back of the
head with confusion, blurred vision, and weakness in both hands for
4 months.

EXAM:
MRI HEAD WITHOUT AND WITH CONTRAST
TECHNIQUE: Multiplanar, multiecho pulse sequences of the brain and surrounding
structures were obtained without and with intravenous contrast.
CONTRAST:  14mL MULTIHANCE GADOBENATE DIMEGLUMINE 529 MG/ML IV SOLN

[Series 2: T1 · sagittal · 5.0mm · 0.47mm/px · 1 of 21 slices shown (1 of 2)]
[im 1/21]
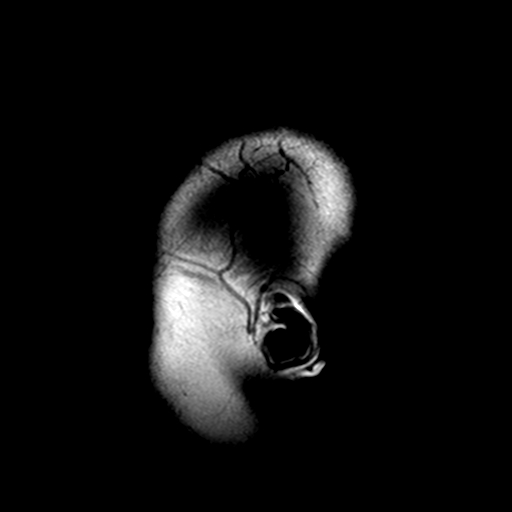

[Series 4: DWI · axial · 3.0mm · 0.94mm/px · z∈[-54,+89]mm · 2 of 50 slices shown (1 of 4)]
[im 1/50]
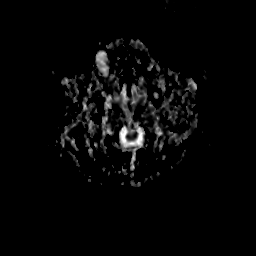
[im 50/50]
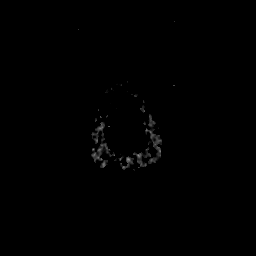

[Series 5: DWI · axial · 3.0mm · 0.94mm/px · z∈[-54,+89]mm · 3 of 50 slices shown (2 of 4)]
[im 1/50]
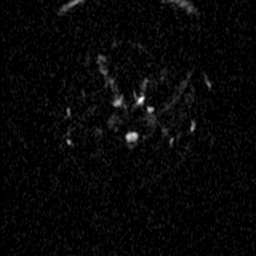
[im 25/50]
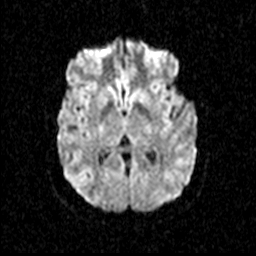
[im 50/50]
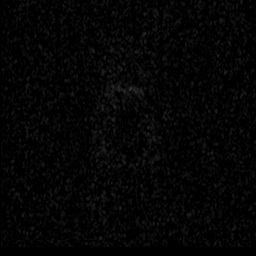

[Series 7: DWI · coronal · 5.0mm · 1.80mm/px · 3 of 39 slices shown (3 of 4)]
[im 1/39]
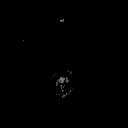
[im 20/39]
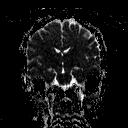
[im 39/39]
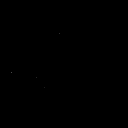

[Series 8: DWI · coronal · 5.0mm · 1.80mm/px · 3 of 38 slices shown (4 of 4)]
[im 1/38]
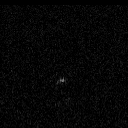
[im 19/38]
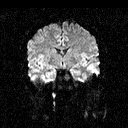
[im 38/38]
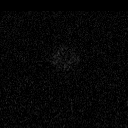

[Series 9: T2 · axial · 5.0mm · 0.45mm/px · z∈[-63,+89]mm · 2 of 23 slices shown (1 of 2)]
[im 1/23]
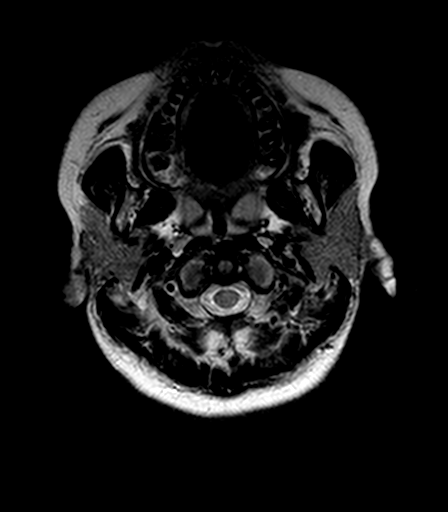
[im 23/23]
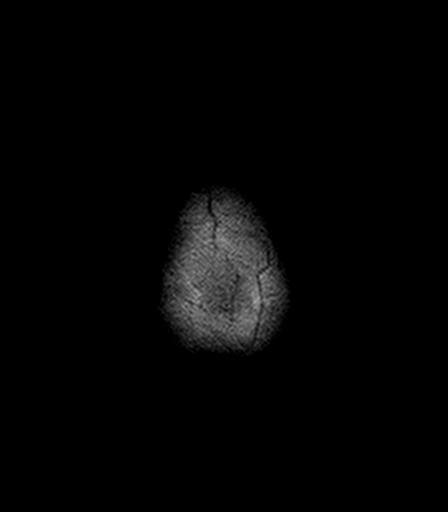

[Series 10: FLAIR · axial · 3.0mm · 0.90mm/px · z∈[-59,+86]mm · 3 of 50 slices shown (1 of 2)]
[im 1/50]
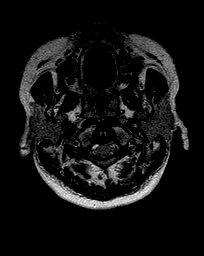
[im 25/50]
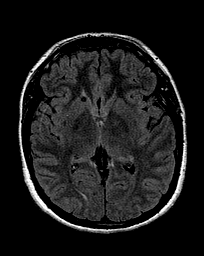
[im 50/50]
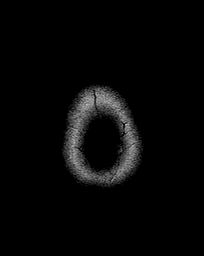

[Series 11: T2 · axial · 5.0mm · 0.45mm/px · z∈[-63,+89]mm · 2 of 23 slices shown (2 of 2)]
[im 1/23]
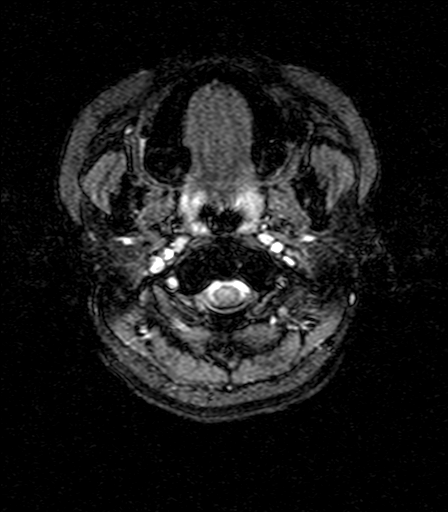
[im 23/23]
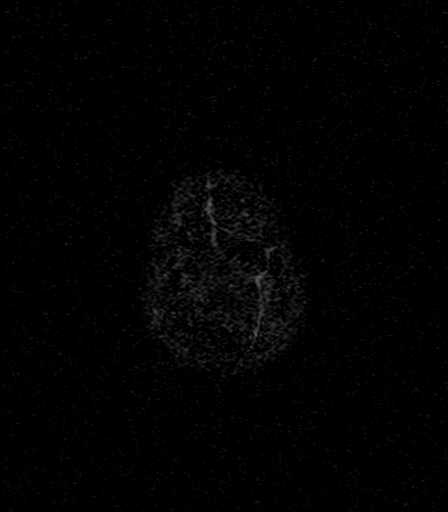

[Series 12: T1 · axial · 1.0mm · 0.50mm/px · z∈[-75,-29]mm · 3 of 176 slices shown (2 of 2)]
[im 1/176]
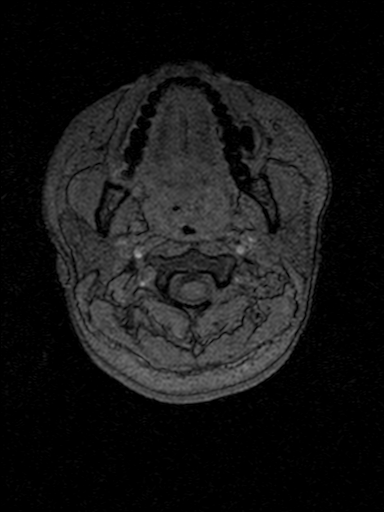
[im 32/176]
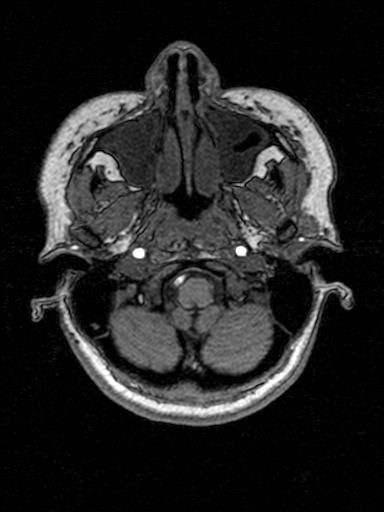
[im 48/176]
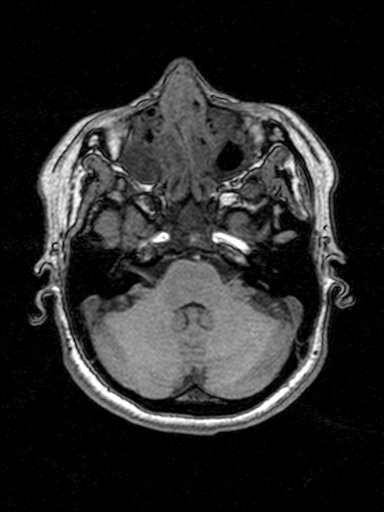

[Series 13: FLAIR · sagittal · 5.0mm · 0.90mm/px · 1 of 21 slices shown (2 of 2)]
[im 1/21]
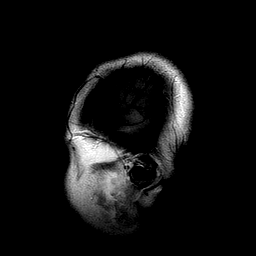

[Series 14: T2 post-contrast · coronal · 5.0mm · 0.45mm/px · 2 of 27 slices shown]
[im 1/27]
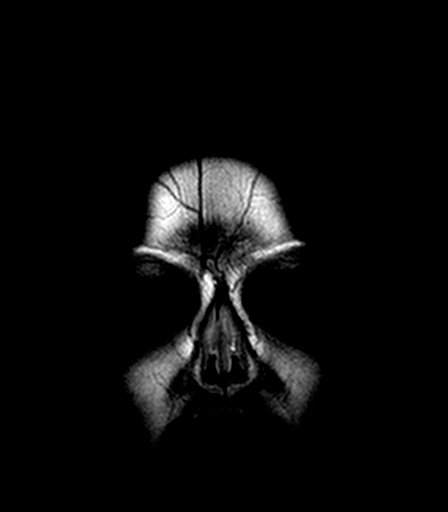
[im 27/27]
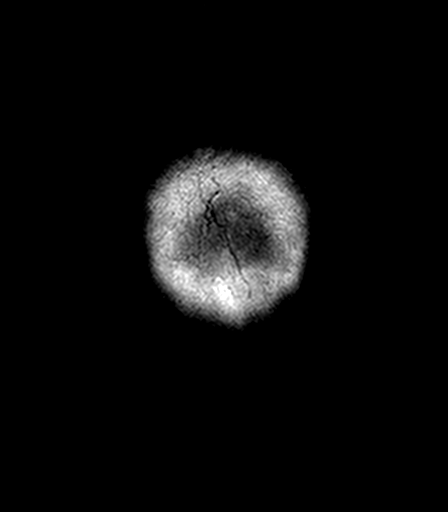

[Series 15: T1 post-contrast · axial · 1.0mm · 0.50mm/px · z∈[-75,+97]mm · 8 of 176 slices shown (1 of 2)]
[im 1/176]
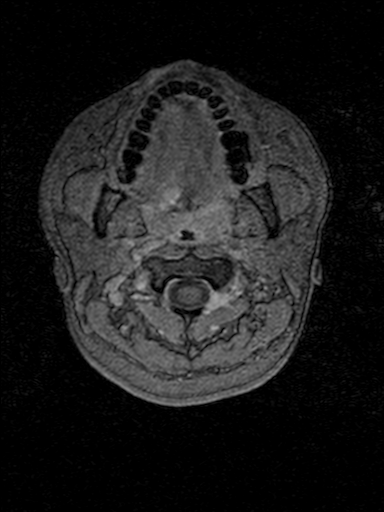
[im 32/176]
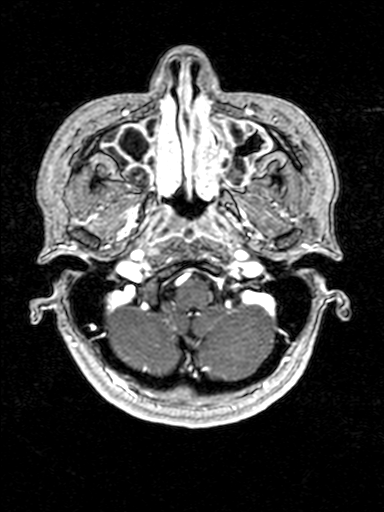
[im 48/176]
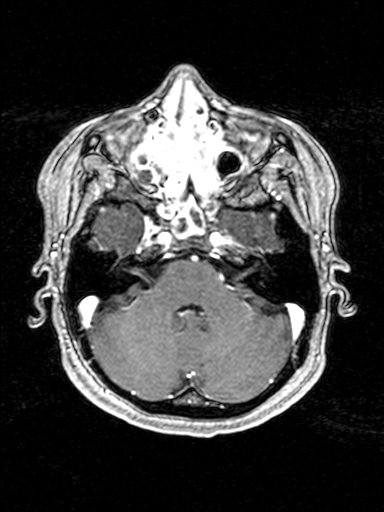
[im 80/176]
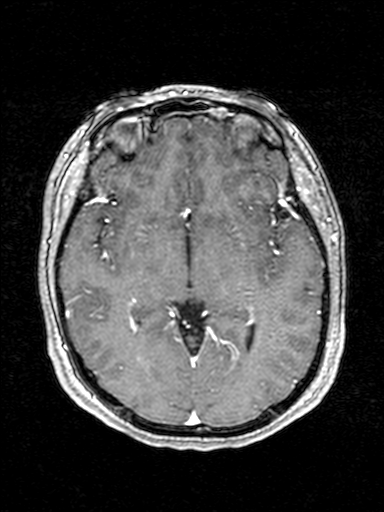
[im 96/176]
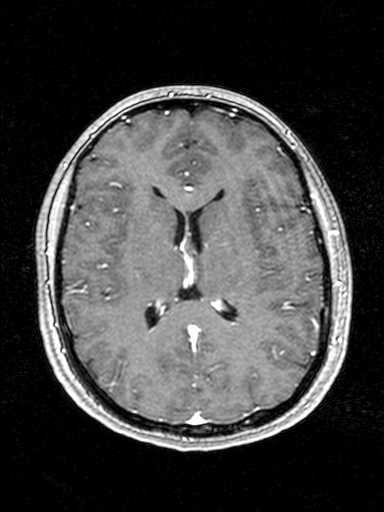
[im 128/176]
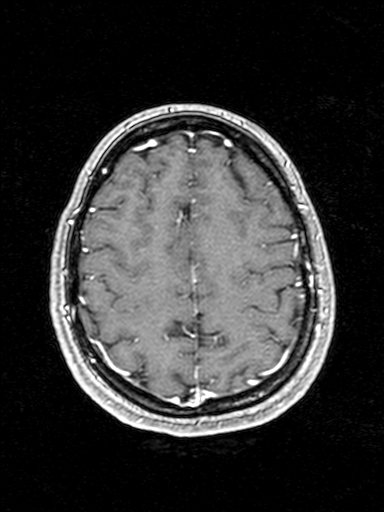
[im 144/176]
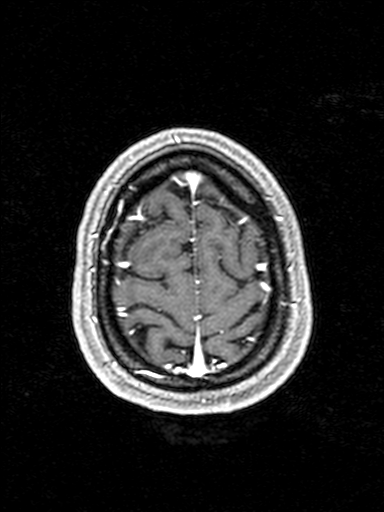
[im 176/176]
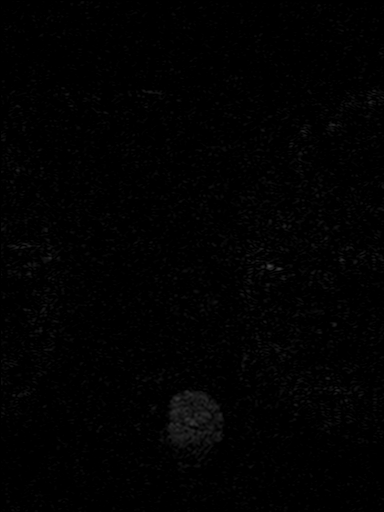

[Series 16: T1 post-contrast · coronal · 5.0mm · 0.45mm/px · 2 of 27 slices shown (2 of 2)]
[im 1/27]
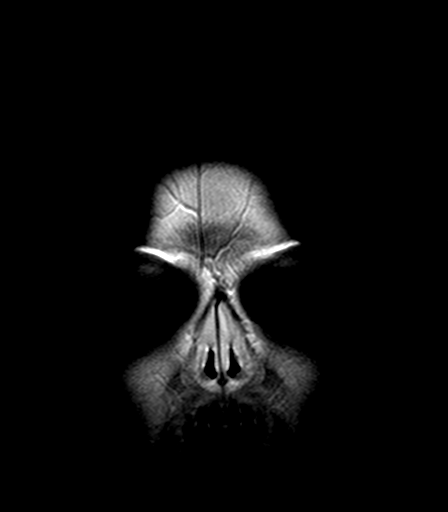
[im 27/27]
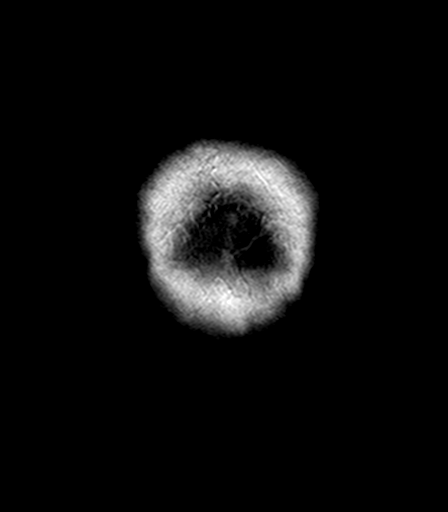

[35 of 48 positions shown; findings below may reference images not displayed]

FINDINGS: Brain: No acute infarction, hemorrhage, hydrocephalus, extra-axial
collection or significant mass effect. 4 mm T2 hypointense, T1
isointense, minimally enhancing focus within the right caudate head
(series 10, image 25) likely representing a small cavernous
malformation. No additional focus of abnormal enhancement in the
brain identified. Single punctate focus of T2 FLAIR hyperintensity
in the left frontal subcortical white matter of unlikely clinical
significance. No white matter lesion is present within corpus
callosum, basal ganglia, or posterior fossa.

Vascular: Normal flow voids.

Skull and upper cervical spine: Normal marrow signal.

Sinuses/Orbits: Extensive paranasal sinus disease with near complete
opacification. No abnormal signal of mastoid air cells. Orbits are
unremarkable.

Other: None.
IMPRESSION: 1. 4 mm cavernous malformation in the right caudate head.
2. Otherwise unremarkable MRI of the brain.

By: Yosvani Dietz M.D.

## 2019-11-11 ENCOUNTER — Ambulatory Visit: Payer: Self-pay | Attending: Internal Medicine

## 2019-11-11 DIAGNOSIS — Z23 Encounter for immunization: Secondary | ICD-10-CM

## 2019-11-11 NOTE — Progress Notes (Signed)
   Covid-19 Vaccination Clinic  Name:  Stacy Blankenship    MRN: [id number] DOB: [birth year]  11/11/2019  Ms. Stacy Blankenship was observed post Covid-19 immunization for 15 minutes without incident. She was provided with Vaccine Information Sheet and instruction to access the V-Safe system.   Ms. Stacy Blankenship was instructed to call 911 with any severe reactions post vaccine: Marland Kitchen Difficulty breathing  . Swelling of face and throat  . A fast heartbeat  . A bad rash all over body  . Dizziness and weakness   Immunizations Administered    Name Date Dose VIS Date Route   Pfizer COVID-19 Vaccine 11/11/2019  4:14 PM 0.3 mL 07/29/2019 Intramuscular   Manufacturer: [employer], [employer]   Lot: [id number]   NDC: [address]-1

## 2019-12-02 ENCOUNTER — Ambulatory Visit: Payer: Self-pay | Attending: Internal Medicine

## 2019-12-02 DIAGNOSIS — Z23 Encounter for immunization: Secondary | ICD-10-CM

## 2019-12-02 NOTE — Progress Notes (Signed)
   Covid-19 Vaccination Clinic  Name:  Stacy Blankenship    MRN: [id number] DOB: 1986/05/04  12/02/2019  Ms. Stacy Blankenship was observed post Covid-19 immunization for 15 minutes without incident. She was provided with Vaccine Information Sheet and instruction to access the V-Safe system.   Ms. Stacy Blankenship was instructed to call 911 with any severe reactions post vaccine: Marland Kitchen Difficulty breathing  . Swelling of face and throat  . A fast heartbeat  . A bad rash all over body  . Dizziness and weakness   Immunizations Administered    Name Date Dose VIS Date Route   Pfizer COVID-19 Vaccine 12/02/2019  4:05 PM 0.3 mL 07/29/2019 Intramuscular   Manufacturer: [employer], [employer]   Lot: [id number]   NDC: [address]-6
